# Patient Record
Sex: Male | Born: 1990 | Race: Black or African American | Hispanic: No | Marital: Single | Smoking: Never smoker
Health system: Southern US, Community
[De-identification: ages and names within clinical notes are randomized; demographics above are authoritative.]

## PROBLEM LIST (undated history)

## (undated) DIAGNOSIS — J45909 Unspecified asthma, uncomplicated: Secondary | ICD-10-CM

---

## 2012-06-17 ENCOUNTER — Encounter (HOSPITAL_COMMUNITY): Payer: Self-pay | Admitting: Emergency Medicine

## 2012-06-17 ENCOUNTER — Emergency Department (HOSPITAL_COMMUNITY): Payer: Medicaid Other

## 2012-06-17 ENCOUNTER — Emergency Department (HOSPITAL_COMMUNITY)
Admission: EM | Admit: 2012-06-17 | Discharge: 2012-06-17 | Disposition: A | Discharge status: Home / Self Care | Payer: Medicaid Other | Attending: Emergency Medicine | Admitting: Emergency Medicine

## 2012-06-17 DIAGNOSIS — Y929 Unspecified place or not applicable: Secondary | ICD-10-CM | POA: Insufficient documentation

## 2012-06-17 DIAGNOSIS — W268XXA Contact with other sharp object(s), not elsewhere classified, initial encounter: Secondary | ICD-10-CM | POA: Insufficient documentation

## 2012-06-17 DIAGNOSIS — Z79899 Other long term (current) drug therapy: Secondary | ICD-10-CM | POA: Insufficient documentation

## 2012-06-17 DIAGNOSIS — S61209A Unspecified open wound of unspecified finger without damage to nail, initial encounter: Secondary | ICD-10-CM | POA: Insufficient documentation

## 2012-06-17 DIAGNOSIS — S61225A Laceration with foreign body of left ring finger without damage to nail, initial encounter: Secondary | ICD-10-CM

## 2012-06-17 DIAGNOSIS — J45909 Unspecified asthma, uncomplicated: Secondary | ICD-10-CM | POA: Insufficient documentation

## 2012-06-17 DIAGNOSIS — Y9389 Activity, other specified: Secondary | ICD-10-CM | POA: Insufficient documentation

## 2012-06-17 HISTORY — DX: Unspecified asthma, uncomplicated: J45.909

## 2012-06-17 NOTE — ED Provider Notes (Signed)
History  This chart was scribed for Darrell Mussel, PA by Erskine Emery, ED Scribe. This patient was seen in room WTR6/WTR6 and the patient's care was started at 22:15.   CSN: [id number]  Arrival date & time 06/17/12  2111   First MD Initiated Contact with Patient 06/17/12 2215      Chief Complaint  Patient presents with  . Extremity Laceration    (Consider location/radiation/quality/duration/timing/severity/associated sxs/prior treatment) The history is provided by the patient. No language interpreter was used.  Darrell Lyons is a 22 y.o. male who presents to the Emergency Department complaining of a laceration to his left ring finger since cutting it on a plastic trash can a couple hours ago. Pt reports he thinks a piece of glass from the side of the trash can might have been lodged inside his finger. The bleeding has since been controlled. Pain with movement and palpation of the finger. No weakness or numbness distally. Pt thinks his Tetanus is UTD.  Past Medical History  Diagnosis Date  . Asthma     History reviewed. No pertinent past surgical history.  Family History  Problem Relation Age of Onset  . Asthma Father   . Asthma Sister   . Asthma Other     History  Substance Use Topics  . Smoking status: Never Smoker   . Smokeless tobacco: Not on file  . Alcohol Use: No      Review of Systems  Constitutional: Negative for fever and chills.  Respiratory: Negative for shortness of breath.   Gastrointestinal: Negative for nausea and vomiting.  Skin: Positive for wound (laceration to left ring finger).  Neurological: Negative for weakness.    Allergies  Review of patient's allergies indicates no known allergies.  Home Medications   Current Outpatient Rx  Name  Route  Sig  Dispense  Refill  . FLUTICASONE-SALMETEROL 250-50 MCG/DOSE IN AEPB   Inhalation   Inhale 1 puff into the lungs every 12 (twelve) hours.            Triage Vitals: BP 125/77   Pulse 70  Temp 98.3 F (36.8 C) (Oral)  Resp 18  SpO2 100%  Physical Exam  Nursing note and vitals reviewed. Constitutional: He is oriented to person, place, and time. He appears well-developed and well-nourished. No distress.  HENT:  Head: Normocephalic and atraumatic.  Eyes: EOM are normal. Pupils are equal, round, and reactive to light.  Neck: Neck supple. No tracheal deviation present.  Cardiovascular: Normal rate.   Pulmonary/Chest: Effort normal. No respiratory distress.  Abdominal: Soft. He exhibits no distension.  Musculoskeletal: Normal range of motion. He exhibits no edema.       Good ROM of all joints in his left ring finger and good strength against resistance with flexion and extension. Normal sensation and cap refill distally  Neurological: He is alert and oriented to person, place, and time.  Skin: Skin is warm and dry.       Puncture wound to proximal left ring finger over the palmar surface with possible foreign body.  Psychiatric: He has a normal mood and affect.    ED Course  Procedures (including critical care time) DIAGNOSTIC STUDIES: Oxygen Saturation is 100% on room air, normal by my interpretation.    COORDINATION OF CARE: 22:47--I evaluated the patient and we discussed a treatment plan including numbing the area to search for a foreign body and x-ray of the hand to which the pt agreed.   22:50--I injected local anesthesia (  2% lidocaine without epinephrine) into the laceration to search for the foreign body. A small piece of glass was found and removed. Wound irrigated, left open.   No results found for this or any previous visit. Dg Finger Ring Left  06/17/2012  *RADIOLOGY REPORT*  Clinical Data: Laceration.  LEFT RING FINGER 2+V  Comparison: None.  Findings: Imaged bones, joints and soft tissues appear normal.  IMPRESSION: Negative exam.   Original Report Authenticated By: Holley Dexter, M.D.           Diagnosis 1. Laceration of ring finger  with foreign body   MDM  Pt with small, less than 1cm laceration to the left ring finger. Wound anesthestized, small piece of glass was removed. X-ray confirmed no radioapaque foreign body post removal of the glass. i irrigated the wound, do not see any more glass. Will leave open. Pt's motor and sensation intact distally. Dressing applied, follow up as needed.       I personally performed the services described in this documentation, which was scribed in my presence. The recorded information has been reviewed and is accurate.    Darrell Mussel, PA 06/17/12 2328

## 2012-06-17 NOTE — ED Provider Notes (Signed)
Medical screening examination/treatment/procedure(s) were performed by non-physician practitioner and as supervising physician I was immediately available for consultation/collaboration.  Doug Sou, MD 06/17/12 [phone]

## 2012-06-17 NOTE — ED Notes (Signed)
Pt states he cut his left ring finger on a plastic trash can  Bleeding controlled

## 2013-07-22 ENCOUNTER — Emergency Department (HOSPITAL_COMMUNITY): Payer: Worker's Compensation

## 2013-07-22 ENCOUNTER — Encounter (HOSPITAL_COMMUNITY): Payer: Self-pay | Admitting: Emergency Medicine

## 2013-07-22 ENCOUNTER — Emergency Department (HOSPITAL_COMMUNITY)
Admission: EM | Admit: 2013-07-22 | Discharge: 2013-07-22 | Disposition: A | Discharge status: Home / Self Care | Payer: Self-pay | Attending: Emergency Medicine | Admitting: Emergency Medicine

## 2013-07-22 DIAGNOSIS — M542 Cervicalgia: Secondary | ICD-10-CM | POA: Insufficient documentation

## 2013-07-22 DIAGNOSIS — M62838 Other muscle spasm: Secondary | ICD-10-CM | POA: Insufficient documentation

## 2013-07-22 DIAGNOSIS — I498 Other specified cardiac arrhythmias: Secondary | ICD-10-CM | POA: Insufficient documentation

## 2013-07-22 DIAGNOSIS — M25519 Pain in unspecified shoulder: Secondary | ICD-10-CM | POA: Insufficient documentation

## 2013-07-22 DIAGNOSIS — Z791 Long term (current) use of non-steroidal anti-inflammatories (NSAID): Secondary | ICD-10-CM | POA: Insufficient documentation

## 2013-07-22 DIAGNOSIS — Z79899 Other long term (current) drug therapy: Secondary | ICD-10-CM | POA: Insufficient documentation

## 2013-07-22 DIAGNOSIS — J45909 Unspecified asthma, uncomplicated: Secondary | ICD-10-CM | POA: Insufficient documentation

## 2013-07-22 MED ORDER — HYDROCODONE-ACETAMINOPHEN 5-325 MG PO TABS: 1.0000 | ORAL_TABLET | ORAL | Status: AC | PRN

## 2013-07-22 MED ORDER — IBUPROFEN 800 MG PO TABS: 800.0000 mg | ORAL_TABLET | Freq: Three times a day (TID) | ORAL | Status: AC

## 2013-07-22 MED ORDER — CYCLOBENZAPRINE HCL 10 MG PO TABS: 10.0000 mg | ORAL_TABLET | Freq: Every day | ORAL | Status: AC

## 2013-07-22 NOTE — ED Provider Notes (Signed)
CSN: [id number]     Arrival date & time 07/22/13  1546 History  This chart was scribed for non-physician practitioner Adah Salvage working with Junius Argyle, MD by Joaquin Music, ED Scribe. This patient was seen in room WTR5/WTR5 and the patient's care was started at 7:22 PM .   Chief Complaint  Patient presents with  . Arm Pain   The history is provided by the patient. No language interpreter was used.   HPI Comments: Darrell Lyons is a 23 y.o. male who presents to the Emergency Department complaining of ongoing worsening R arm pain that began last night and worsened today. Pt states he suspects he may have pulled a muscle and reports "taking out the trash while at work and states he had to lift up the trash and throw it above his head, leading with his right arm". He states he was sitting on his couch and began having R shoulder pain. Pt states certain movements worsen the pain. He states he got a tattoo 1 week ago to R forearm and states he was seated with his hand supinate. Pt denies any recent falls or traumas. Pt denies numbness and tingling to bilateral hands. Pt denies taking any OTC medications. Pt denies having a PCP. Pt denies SOB.   Past Medical History  Diagnosis Date  . Asthma    History reviewed. No pertinent past surgical history. Family History  Problem Relation Age of Onset  . Asthma Father   . Asthma Sister   . Asthma Other    History  Substance Use Topics  . Smoking status: Never Smoker   . Smokeless tobacco: Not on file  . Alcohol Use: No    Review of Systems  Respiratory: Negative for cough and shortness of breath.   Cardiovascular: Negative for chest pain.  Musculoskeletal: Positive for arthralgias and neck pain.  Neurological: Negative for tremors, weakness and numbness.   Allergies  Review of patient's allergies indicates no known allergies.  Home Medications   Current Outpatient Rx  Name  Route  Sig  Dispense  Refill  .  albuterol (PROVENTIL HFA;VENTOLIN HFA) 108 (90 BASE) MCG/ACT inhaler   Inhalation   Inhale 2 puffs into the lungs every 4 (four) hours as needed for wheezing or shortness of breath.         . Fluticasone-Salmeterol (ADVAIR) 250-50 MCG/DOSE AEPB   Inhalation   Inhale 1 puff into the lungs every 12 (twelve) hours.         . cyclobenzaprine (FLEXERIL) 10 MG tablet   Oral   Take 1 tablet (10 mg total) by mouth at bedtime.   10 tablet   0   . HYDROcodone-acetaminophen (NORCO/VICODIN) 5-325 MG per tablet   Oral   Take 1 tablet by mouth every 4 (four) hours as needed. Take with meals   6 tablet   0   . ibuprofen (ADVIL,MOTRIN) 800 MG tablet   Oral   Take 1 tablet (800 mg total) by mouth 3 (three) times daily.   21 tablet   0    Triage Vitals:BP 124/70  Pulse 54  Temp(Src) 97.7 F (36.5 C) (Oral)  Resp 16  SpO2 100%  Physical Exam  Nursing note and vitals reviewed. Constitutional: He is oriented to person, place, and time. He appears well-developed and well-nourished. No distress.  HENT:  Head: Normocephalic and atraumatic.  Eyes: EOM are normal.  Neck: Normal range of motion and full passive range of motion without pain. Neck supple.  Muscular tenderness present. No spinous process tenderness present. No tracheal deviation present.  Patient reports discomfort with turning of the head to the Left.  Soft tissue tenderness to palpation of Right sternocleidomastoid.  Cardiovascular: Regular rhythm and normal heart sounds.  Bradycardia present.   Pulses:      Radial pulses are 2+ on the right side.  Pulmonary/Chest: Effort normal and breath sounds normal. No respiratory distress. He has no decreased breath sounds. He has no wheezes. He has no rhonchi. He has no rales. He exhibits no tenderness.  Abdominal: Soft.  Musculoskeletal: Normal range of motion.       Right shoulder: He exhibits tenderness and spasm. He exhibits normal range of motion, no bony tenderness, no swelling,  no deformity and normal pulse.       Arms: Trapezius tenderness to palpation and muscle spasm not just superior to the spine of the scapula. Full Active ROM with R shoulder.    Neurological: He is alert and oriented to person, place, and time. No sensory deficit. He exhibits normal muscle tone.  Reflex Scores:      Bicep reflexes are 2+ on the right side and 2+ on the left side. Skin: Skin is warm and dry. No rash noted. No erythema.  Right forearm with black tattoo, no increase in warmth to touch, no drainage, or surrounding erythema.   Psychiatric: He has a normal mood and affect. His behavior is normal.    ED Course  Procedures  COORDINATION OF CARE: 7:29 PM-Discussed treatment plan which includes will discharge pt with pain medication and muscle relaxer. Advised pt to apply heat and ice to area and to F/U with orthopedist. Pt agreed to plan.   Labs Review Labs Reviewed - No data to display Imaging Review No results found.  EKG Interpretation   None      MDM   Final diagnoses:  Muscle spasm of right shoulder   Pt with a history of Right shoulder pain.  PE: muscle spasm to the Trapezius. Tattoo to Right Forearm without signs of infection. Pt with a negative history of trauma, I don't believe imaging is warranted at this time. Will try conservative therapy. Pain medication, muscle relaxer. Ice therapy at this time. Discussed treatment plan with the patient. Return precautions given. Reports understanding and no other concerns at this time.  Patient is stable for discharge at this time. Meds given in ED:  Medications - No data to display  New Prescriptions   CYCLOBENZAPRINE (FLEXERIL) 10 MG TABLET    Take 1 tablet (10 mg total) by mouth at bedtime.   HYDROCODONE-ACETAMINOPHEN (NORCO/VICODIN) 5-325 MG PER TABLET    Take 1 tablet by mouth every 4 (four) hours as needed. Take with meals   IBUPROFEN (ADVIL,MOTRIN) 800 MG TABLET    Take 1 tablet (800 mg total) by mouth 3 (three)  times daily.    I personally performed the services described in this documentation, which was scribed in my presence. The recorded information has been reviewed and is accurate.   Clabe SealLauren M Darlynn Ricco, PA-C 07/22/13 2026

## 2013-07-22 NOTE — ED Notes (Signed)
Pt reports right arm pain that began last night at 2300. Pt denies fall or trauma, however states he took out a large container of trash earlier in the day. Pt reports the pain radiates to the right side of the neck and upper back. Pt is A/O x4, in NAD, and vitals are WDL.

## 2013-07-22 NOTE — Discharge Instructions (Signed)
Call for a follow up appointment with a Family or Primary Care Provider.  Return if Symptoms worsen.   Take medication as prescribed. You can take Norco for extreme pain, Try and take Ibuprofen for pain and spasm. You can use warm heat after 48 hours after the pain began.    Emergency Department Resource Guide 1) Find a Doctor and Pay Out of Pocket Although you won't have to find out who is covered by your insurance plan, it is a good idea to ask around and get recommendations. You will then need to call the office and see if the doctor you have chosen will accept you as a new patient and what types of options they offer for patients who are self-pay. Some doctors offer discounts or will set up payment plans for their patients who do not have insurance, but you will need to ask so you aren't surprised when you get to your appointment.  2) Contact Your Local Health Department Not all health departments have doctors that can see patients for sick visits, but many do, so it is worth a call to see if yours does. If you don't know where your local health department is, you can check in your phone book. The CDC also has a tool to help you locate your state's health department, and many state websites also have listings of all of their local health departments.  3) Find a Walk-in Clinic If your illness is not likely to be very severe or complicated, you may want to try a walk in clinic. These are popping up all over the country in pharmacies, drugstores, and shopping centers. They're usually staffed by nurse practitioners or physician assistants that have been trained to treat common illnesses and complaints. They're usually fairly quick and inexpensive. However, if you have serious medical issues or chronic medical problems, these are probably not your best option.  No Primary Care Doctor: - Call Health Connect at  [phone] - they can help you locate a primary care doctor that  accepts your insurance,  provides certain services, etc. - Physician Referral Service- [phone]  Chronic Pain Problems: Organization         Address  Phone   Notes  [facility]  [phone] Patients need to be referred by their primary care doctor.   Medication Assistance: Organization         Address  Phone   Notes  [facility] Medication [facility] [address] [place], [place] [address] [phone] --Must be a resident of [facility] -- Must have NO insurance coverage whatsoever (no Medicaid/ Medicare, etc.) -- The pt. MUST have a primary care doctor that directs their care regularly and follows them in the community   [facility]  [phone]   [employer]  [phone]    Agencies that provide inexpensive medical care: Organization         Address  Phone   Notes  [facility]  [phone]   [facility]    [phone]   [facility] [address] [place], [place] [address] [phone]   [facility] [address], [place] [phone]   [facility]anned Parenthood    [phone]   [facility]    [phone]   [facility]  [address], [place] Phone:  [phone], Fax:  [phone] Hours of Operation:  9 am - 6 pm, M-F.  Also accepts Medicaid/Medicare and self-pay.  [facility]  [address], [place] Phone: [phone], Fax: [phone]) [phone]. Hours of Operation:  8:30 am - 5:30 pm, M-F.  Also accepts Medicaid and self-pay.  [facility] [place] [address], [place] Phone: [phone]   [facility] [address], [place], [place] [phone], Ext. [phone] Mondays & Thursdays: 7-9 AM.  First 15 patients are seen on a first come, first serve basis.    Medicaid-accepting [facility] Providers:  Organization         Address  Phone    Notes  [facility] [address], [place] [phone] Also accepts self-pay patients.  [facility] [address], [place]  [phone]) [phone]   [facility] [address], [place] [phone]   [facility] [address] [place] [address], [place] [phone]   Renaye RakersVeita Bland [address], [place]   [phone] Only accepts [place] Access [place] patients after they have their name applied to their card.   Self-Pay (no insurance) in [facility]:  Organization         Address  Phone   Notes  Sickle Cell Patients, [facility] [address], [place] [phone]   [facility] [address], [place] [phone]   [facility] [place]  [address], [place] [phone]) [phone]   [facility]/Dr. Osei-Bonsu  [address] [place] [address], [place] or [phone], [place] [phone] Phone number for both [place] and [place] locations is the same.  [facility] [address], [place] [phone]) [phone]   [facility] [address] [place] [address], [place] or [address] [phone]) [phone]   [facility] [address], [place] [phone]) [phone], phone; [phone]) [phone], fax Sees patients 1st and 3rd Saturday of every month.  Must not qualify for public or private insurance (i.e. Medicaid, Medicare, [place] Health Choice, Veterans' Benefits)  Household income should be no more than 200% of the poverty level The clinic cannot treat you if you are pregnant or think you are pregnant  Sexually transmitted diseases are not treated at the clinic.    Dental Care: Organization         Address  Phone  Notes  [facility] of [facility] [address], [place] [phone]  [phone] Accepts children up to age 22 who are enrolled in [place] or [place] Health Choice; pregnant women with a Medicaid card; and children who have applied for Medicaid or [place] Health Choice, but were declined, whose parents can pay a reduced fee at time of service.  [facility] of [facility] [place]  [address], [place] [phone] Accepts children up to age 22 who are enrolled in [place] or [place] Health Choice; pregnant women with a Medicaid card; and children who have applied for Medicaid or [place] Health Choice, but were declined, whose parents can pay a reduced fee at time of service.  [facility]  [address], [place] [phone] Patients are seen by appointment only. Walk-ins are not accepted. [facility] will see patients 18 years  of age and older. Monday - Tuesday (8am-5pm) Most Wednesdays (8:30-5pm) $30 per visit, cash only  [facility]  [address], [facility] [phone] Patients are seen by appointment only. Walk-ins are not accepted. [facility] will see patients 22 years of age and older. One Wednesday Evening (Monthly: Volunteer Based).  $30 per visit, cash only  [employer]  [phone] for adults; Children under age 194, call Graduate Pediatric Dentistry at [phone]. Children aged 354-14, please call [phone] to request a pediatric application.  Dental services are provided in all areas of dental care including fillings, crowns and bridges, complete and partial dentures, implants, gum treatment, root canals, and extractions. Preventive care is also provided. Treatment is provided to both adults and children. Patients are selected via a lottery and there is often a waiting list.   [facility] [address], [place]  [phone] [email or web address]   [facility] [address], [place], [place] [phone], Ext.  123 Second and Fourth Thursday of each month, opens at 6:30 AM; Clinic ends at 9 AM.  Patients are seen on a first-come first-served basis, and a limited number are seen during each clinic.   [facility]  [address], [place], [place] [phone]   Eligibility Requirements You must have lived in [place], [place], or [place] counties for at least the last three months.   You cannot be eligible for state or federal sponsored National Cityhealthcare insurance, including CIGNAVeterans Administration, [place]icaid, or [employer] EntertainmentMedicare.   You generally cannot be eligible for healthcare insurance through your employer.    How to apply: Eligibility screenings are held every Tuesday and Wednesday afternoon from 1:00 pm until 4:00 pm. You do not need an appointment for the interview!  [facility] [address], [place], [place] [phone]   [facility]  [phone]   [facility]  [phone]   [facility]  [phone]    Behavioral Health Resources in the Community: Intensive Outpatient Programs Organization         Address  Phone  Notes  [facility] [address], [place], [place] [phone]   [facility] [address], [place], [place] [phone]   [facility] [address], [place], [place]  [phone]   [facility] [address],  [address] [place], [place] [phone] or [phone]   Substance Abuse Resources Organization         Address  Phone  Notes  [facility]  [phone]   [facility]  [phone]   [facility]  [phone]   [facility]  [phone]   Residential & Outpatient Substance Abuse Program  [phone]   Psychological Services Organization         Address  Phone  Notes  [facility]  [phone]   [facility]  [phone]- [phone]   [facility] [address], [place] [phone] or [phone]    Mobile Crisis Teams Organization         Address  Phone  Notes  [facility], Mobile Crisis Care Unit  [phone]   [facility]  [address] [place], [place] [phone]   [facility] [address] [place] [phone]

## 2013-07-23 NOTE — ED Provider Notes (Signed)
Medical screening examination/treatment/procedure(s) were performed by non-physician practitioner and as supervising physician I was immediately available for consultation/collaboration.  EKG Interpretation   None         Junius ArgyleForrest S Jaslynn Thome, MD 07/23/13 1331

## 2014-05-14 IMAGING — CR DG FINGER RING 2+V*L*
3 series · 3 of 3 positions shown · non-contrast
Comparison: None.

CLINICAL DATA: Laceration.

LEFT RING FINGER 2+V

[x finger pa left]
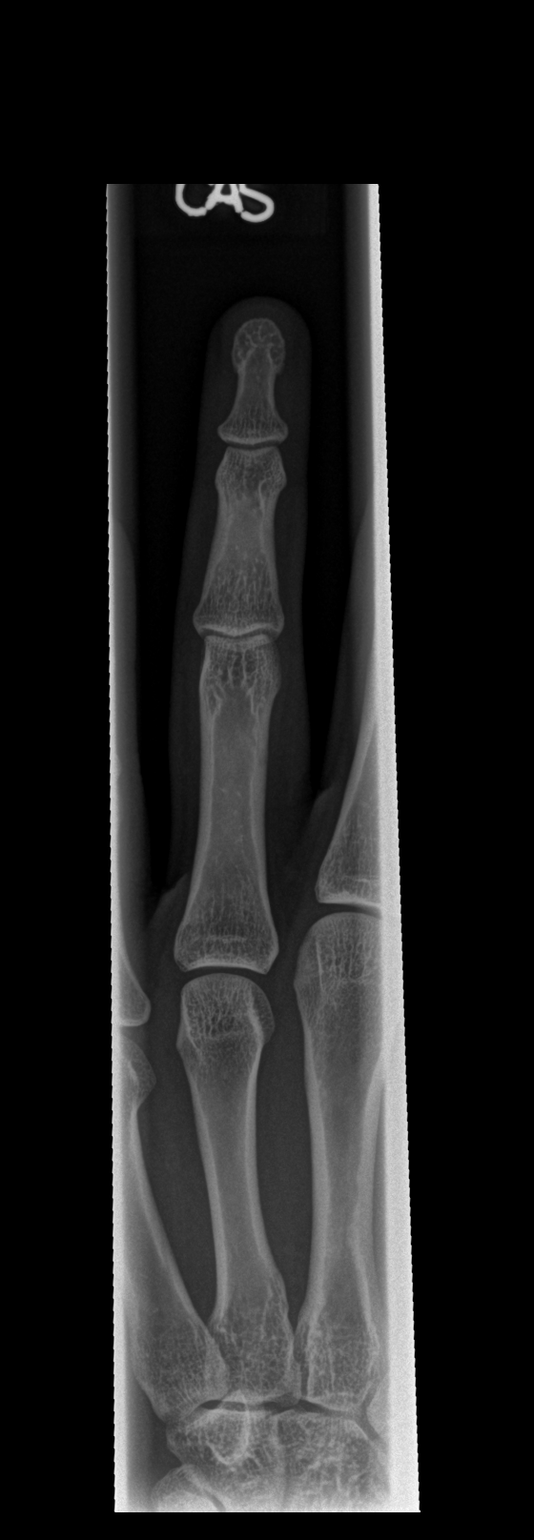

[x finger obl left]
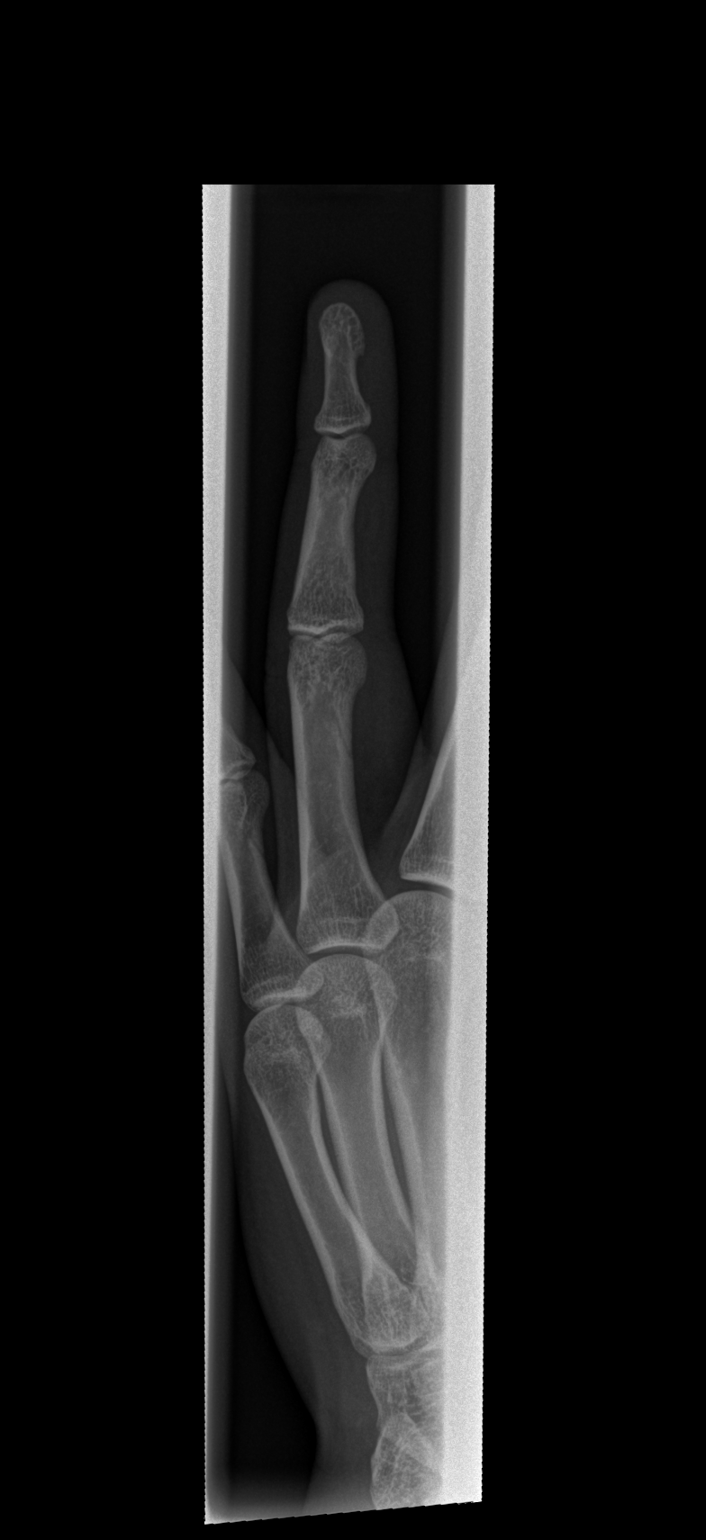

[x finger lat left]
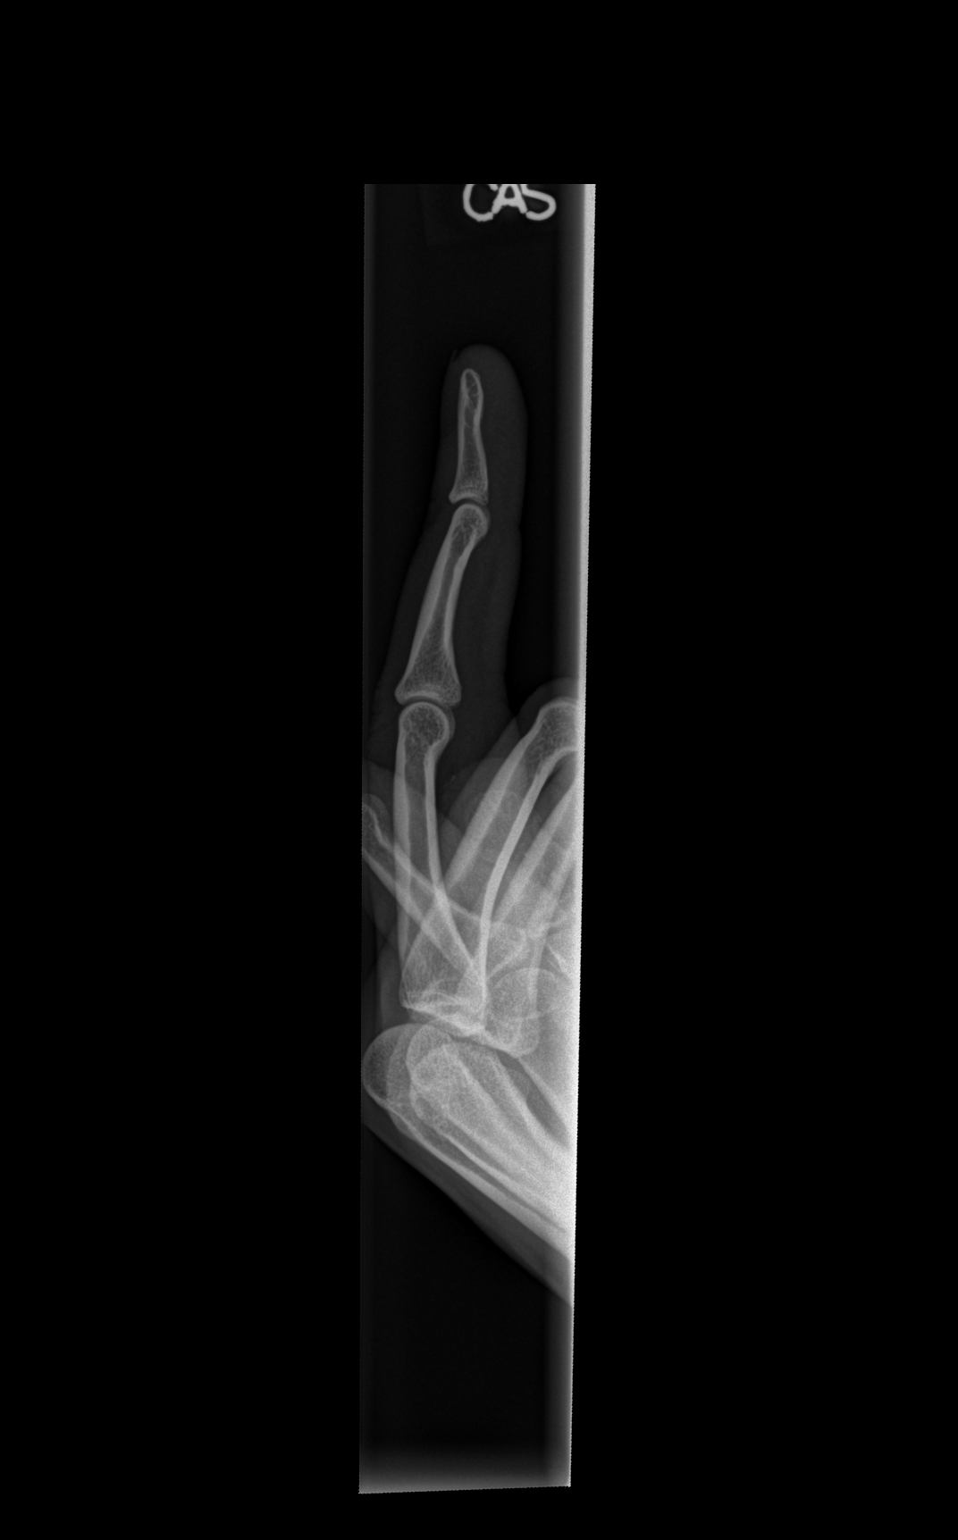

[3 of 3 positions shown; findings below may reference images not displayed]

FINDINGS: Imaged bones, joints and soft tissues appear normal.
IMPRESSION: Negative exam.

## 2023-10-12 ENCOUNTER — Other Ambulatory Visit: Payer: Self-pay

## 2023-10-12 ENCOUNTER — Emergency Department (HOSPITAL_COMMUNITY)
Admission: EM | Admit: 2023-10-12 | Discharge: 2023-10-12 | Disposition: A | Discharge status: Home / Self Care | Attending: Emergency Medicine | Admitting: Emergency Medicine

## 2023-10-12 DIAGNOSIS — S6991XA Unspecified injury of right wrist, hand and finger(s), initial encounter: Secondary | ICD-10-CM | POA: Diagnosis present

## 2023-10-12 DIAGNOSIS — S61210A Laceration without foreign body of right index finger without damage to nail, initial encounter: Secondary | ICD-10-CM | POA: Insufficient documentation

## 2023-10-12 DIAGNOSIS — W260XXA Contact with knife, initial encounter: Secondary | ICD-10-CM | POA: Insufficient documentation

## 2023-10-12 DIAGNOSIS — Z23 Encounter for immunization: Secondary | ICD-10-CM | POA: Insufficient documentation

## 2023-10-12 MED ORDER — BACITRACIN ZINC 500 UNIT/GM EX OINT
TOPICAL_OINTMENT | Freq: Once | CUTANEOUS | Status: AC
  Administered 2023-10-12: 1 via TOPICAL
  Filled 2023-10-12: qty 0.9

## 2023-10-12 MED ORDER — ACETAMINOPHEN 500 MG PO TABS
1000.0000 mg | ORAL_TABLET | Freq: Once | ORAL | Status: AC
Start: 2023-10-12 — End: 2023-10-12
  Administered 2023-10-12: 1000 mg via ORAL
  Filled 2023-10-12: qty 2

## 2023-10-12 MED ORDER — TETANUS-DIPHTH-ACELL PERTUSSIS 5-2.5-18.5 LF-MCG/0.5 IM SUSY
0.5000 mL | PREFILLED_SYRINGE | Freq: Once | INTRAMUSCULAR | Status: AC
  Administered 2023-10-12: 0.5 mL via INTRAMUSCULAR
  Filled 2023-10-12: qty 0.5

## 2023-10-12 MED ORDER — LIDOCAINE HCL (PF) 1 % IJ SOLN
5.0000 mL | Freq: Once | INTRAMUSCULAR | Status: AC
  Administered 2023-10-12: 5 mL
  Filled 2023-10-12: qty 30

## 2023-10-12 NOTE — Discharge Instructions (Signed)
 You had 3 non-absorbable sutures placed today. You must get your sutures rechecked for possible removal in 7-10 days. We recommend visiting your PCP or an urgent care for suture removal. However, you may also return back to the ER if you are unable to be seen by your PCP or at urgent care.   You may gently clean the area around your laceration as needed with soap and water. Place antibiotic ointment such as bacitracin  or neosporin over your laceration after cleaning the area.  Keep the laceration covered with sterile gauze as shown here if you are doing an activity in which it may get dirty. You may pick these supplies up at any drugstore.  Do not submerge your laceration in water (no baths, swimming) until it is fully healed. You may shower.   You may take up to 1000mg  of tylenol  every 6 hours as needed for pain. Do not take more then 4g per day.   You may use up to 600mg  ibuprofen  every 6 hours as needed for pain.  Do not exceed 2.4g of ibuprofen  per day.  Return to the ER should you develop fever, chills, pus drainage from your wound, redness around your wound.

## 2023-10-12 NOTE — ED Provider Notes (Signed)
 [place] EMERGENCY DEPARTMENT AT [facility] Provider Note   CSN: [id number] Arrival date & time: 10/12/23  1721     History  Chief Complaint  Patient presents with   Laceration    Pt sliced R sided pointer finger with knife approx 10 minutes ago while helping sister unpack, took 800mg  ibuprofen  PTA. Approx 1 inch laceration c sub1 tissue showing, edges approximated, bleeding controlled, Darrell Lyons is a 33 y.o. male presents with concern for a cut to the top of his right pointer finger that occurred just prior to arrival.  States he was cutting a box on the knife slipped and cut his finger.  Bleeding well-controlled upon arrival.  Patient does not know when last tetanus was.  Denies any numbness or tingling in the finger.  Reports he had a tendon injury in the right pointer finger several years ago and has not been able to bend at the joints of the finger for a while.  No change in his range of motion of that finger.   Laceration      Home Medications Prior to Admission medications   Medication Sig Start Date End Date Taking? Authorizing Provider  albuterol (PROVENTIL HFA;VENTOLIN HFA) 108 (90 BASE) MCG/ACT inhaler Inhale 2 puffs into the lungs every 4 (four) hours as needed for wheezing or shortness of breath.    [provider]  cyclobenzaprine  (FLEXERIL ) 10 MG tablet Take 1 tablet (10 mg total) by mouth at bedtime. 07/22/13   Markus Sill, PA-C  Fluticasone-Salmeterol (ADVAIR) 250-50 MCG/DOSE AEPB Inhale 1 puff into the lungs every 12 (twelve) hours.    [provider]  HYDROcodone -acetaminophen  (NORCO/VICODIN) 5-325 MG per tablet Take 1 tablet by mouth every 4 (four) hours as needed. Take with meals 07/22/13   Markus Sill, PA-C  ibuprofen  (ADVIL ,MOTRIN ) 800 MG tablet Take 1 tablet (800 mg total) by mouth 3 (three) times daily. 07/22/13   Markus Sill, PA-C      Allergies    Patient has no known allergies.    Review of Systems    Review of Systems  Physical Exam Updated Vital Signs BP 126/71   Pulse 99   Temp 97.6 F (36.4 C) (Oral)   Resp 18   SpO2 95%  Physical Exam Vitals and nursing note reviewed.  Constitutional:      Appearance: Normal appearance.  HENT:     Head: Atraumatic.  Cardiovascular:     Comments: 2+ radial pulse bilaterally  Cap refill less than 2 seconds in the right index finger Pulmonary:     Effort: Pulmonary effort is normal.  Musculoskeletal:     Comments: Right upper extremity: General 2 cm linear laceration to the dorsum of the patient's right index finger overlying the middle phalanx.  No visualization of bone.  No foreign debris noted.  Palpation Nontender of the carpal bones diffusely, no snuffbox TTP Nontender over the 1st through 5th metacarpals, 1st through 5th phalanges  ROM Right index finger maintained in extension.  Patient unable to flex at the DIP or PIP of the right index finger, states this is from an old tendon injury and not from this injury today.  Able to flex and extend at the MCP of the right second digit.   Sensation: Sensation intact throughout the 1st-5th digits   Neurological:     General: No focal deficit present.     Mental Status: He is alert.  Psychiatric:  Mood and Affect: Mood normal.        Behavior: Behavior normal.       ED Results / Procedures / Treatments   Labs (all labs ordered are listed, but only abnormal results are displayed) Labs Reviewed - No data to display  EKG None  Radiology No results found.  Procedures .Laceration Repair  Date/Time: 10/12/2023 7:54 PM  Performed by: Rexie Catena, PA-C Authorized by: Rexie Catena, PA-C   Consent:    Consent obtained:  Verbal   Consent given by:  Patient   Risks, benefits, and alternatives were discussed: yes     Risks discussed:  Infection, pain and poor cosmetic result   Alternatives discussed:  No treatment Universal protocol:    Procedure  explained and questions answered to patient or proxy's satisfaction: yes     Patient identity confirmed:  Verbally with patient Anesthesia:    Anesthesia method:  Local infiltration   Local anesthetic:  Lidocaine  1% w/o epi (5ml) Laceration details:    Location:  Finger   Finger location:  R index finger   Length (cm):  2   Depth (mm):  1 Pre-procedure details:    Preparation:  Patient was prepped and draped in usual sterile fashion Exploration:    Wound exploration: wound explored through full range of motion and entire depth of wound visualized     Wound extent: no foreign body, no signs of injury, no nerve damage, no tendon damage and no underlying fracture   Treatment:    Area cleansed with:  Povidone-iodine   Irrigation solution:  Sterile saline   Irrigation volume:  1L   Irrigation method:  Syringe   Debridement:  None   Undermining:  None Skin repair:    Repair method:  Sutures   Suture size:  6-0   Suture material:  Prolene   Suture technique:  Simple interrupted   Number of sutures:  3 Repair type:    Repair type:  Simple Post-procedure details:    Dressing:  Antibiotic ointment and non-adherent dressing   Procedure completion:  Tolerated well, no immediate complications     Medications Ordered in ED Medications  lidocaine  (PF) (XYLOCAINE ) 1 % injection 5 mL (5 mLs Infiltration Given 10/12/23 1748)  Tdap (BOOSTRIX ) injection 0.5 mL (0.5 mLs Intramuscular Given 10/12/23 1749)  bacitracin  ointment (1 Application Topical Given 10/12/23 1749)  acetaminophen  (TYLENOL ) tablet 1,000 mg (1,000 mg Oral Given 10/12/23 1748)    ED Course/ Medical Decision Making/ A&P                                 Medical Decision Making Risk OTC drugs. Prescription drug management.     Differential diagnosis includes but is not limited to laceration, tendon injury, nerve injury, cellulitis  ED Course:  Upon initial evaluation, patient is well-appearing, stable vitals.  Has 2 cm  laceration to the dorsum of right index finger over the middle phalanx.  Very superficial. Baseline range of motion, no concern for tendon injury.  Neurovascular intact in the right upper extremity and right index finger Laceration was cleaned and irrigated well with 1L sterile saline. Entire depth of wound visualized without any evidence of exposed bone or foreign body.  Wound repaired with simple interrupted suture.  Patient tolerated this well. Patient's tetanus was updated    Medications Given: Tylenol  for pain Tdap   Impression: Laceration to right index finger  Disposition:  The  patient was discharged home with instructions to follow-up with PCP or return to an urgent care or ER for suture removal in 7 to 10 days.  Keep area clean with soap and water.  Cover with antibiotic ointment such as bacitracin  or Neosporin.  Cover with a Band-Aid if doing activities where he may get soiled.  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  Tylenol  and ibuprofen  as needed for pain. Return precautions given.    This chart was dictated using voice recognition software, Dragon. Despite the best efforts of this provider to proofread and correct errors, errors may still occur which can change documentation meaning.          Final Clinical Impression(s) / ED Diagnoses Final diagnoses:  Laceration of right index finger without foreign body without damage to nail, initial encounter    Rx / DC Orders ED Discharge Orders     None         Rexie Catena, PA-C 10/12/23 1955    Flonnie Humphrey, DO 10/12/23 2052
